# Patient Record
Sex: Male | Born: 1945 | Race: Black or African American | Hispanic: No | Marital: Married
Health system: Southern US, Community
[De-identification: ages and names within clinical notes are randomized; demographics above are authoritative.]

---

## 2006-07-04 ENCOUNTER — Other Ambulatory Visit: Payer: Self-pay

## 2006-07-04 ENCOUNTER — Inpatient Hospital Stay: Payer: Self-pay | Admitting: General Surgery

## 2007-10-22 ENCOUNTER — Ambulatory Visit: Payer: Self-pay | Admitting: Family Medicine

## 2009-01-18 ENCOUNTER — Inpatient Hospital Stay: Payer: Self-pay | Admitting: Internal Medicine

## 2009-03-01 ENCOUNTER — Ambulatory Visit: Payer: Self-pay | Admitting: Gastroenterology

## 2009-03-01 DIAGNOSIS — R933 Abnormal findings on diagnostic imaging of other parts of digestive tract: Secondary | ICD-10-CM

## 2009-03-01 LAB — CONVERTED CEMR LAB
Albumin: 3.6 g/dL (ref 3.5–5.2)
Alkaline Phosphatase: 95 units/L (ref 39–117)
BUN: 14 mg/dL (ref 6–23)
Basophils Relative: 0.8 % (ref 0.0–3.0)
Eosinophils Relative: 4 % (ref 0.0–5.0)
GFR calc non Af Amer: 54.39 mL/min (ref >60)
Glucose, Bld: 360 mg/dL — ABNORMAL HIGH (ref 70–99)
INR: 1 (ref 0.8–1.0)
MCV: 96.3 fL (ref 78.0–100.0)
Monocytes Absolute: 0.4 10*3/uL (ref 0.1–1.0)
Monocytes Relative: 4.7 % (ref 3.0–12.0)
Neutrophils Relative %: 68.1 % (ref 43.0–77.0)
Potassium: 4.4 meq/L (ref 3.5–5.1)
RBC: 4.49 M/uL (ref 4.22–5.81)
Total Bilirubin: 1 mg/dL (ref 0.3–1.2)
WBC: 8.9 10*3/uL (ref 4.5–10.5)

## 2009-03-17 ENCOUNTER — Encounter (INDEPENDENT_AMBULATORY_CARE_PROVIDER_SITE_OTHER): Payer: Self-pay | Admitting: *Deleted

## 2009-03-17 ENCOUNTER — Ambulatory Visit (HOSPITAL_COMMUNITY): Admission: RE | Admit: 2009-03-17 | Discharge: 2009-03-17 | Payer: Self-pay | Admitting: Gastroenterology

## 2009-03-17 ENCOUNTER — Encounter: Payer: Self-pay | Admitting: Gastroenterology

## 2009-09-05 ENCOUNTER — Telehealth: Payer: Self-pay | Admitting: Gastroenterology

## 2010-09-26 NOTE — Progress Notes (Signed)
Summary: med questions  Phone Note Call from Patient Call back at Home Phone [phone]   Caller: Debarah Crape, wife Call For: Dr. Christella Hartigan Reason for Call: Talk to Nurse Summary of Call: pt's ins changed and will no longer cover Creon... would like something their new ins will cover... would like to know if Pancreaze specifically would be an ok replacement Initial call taken by: Vallarie Mare,  September 05, 2009 3:27 PM  Follow-up for Phone Call        Called pt and Ins will cover pancrease Chales Abrahams CMA Duncan Dull)  September 05, 2009 3:38 PM   Additional Follow-up for Phone Call Additional follow up Details #1::        pancrease will work well.  let him know that I called it into his pharmacy Additional Follow-up by: Rachael Fee MD,  September 06, 2009 9:20 AM    Additional Follow-up for Phone Call Additional follow up Details #2::    pt aware Follow-up by: Chales Abrahams CMA Duncan Dull),  September 06, 2009 10:26 AM  New/Updated Medications: PANCREAZE 14782 UNIT CPEP (PANCRELIPASE (LIP-PROT-AMYL)) take 2 pills with every meal and one pill with every snack Prescriptions: PANCREAZE 95621 UNIT CPEP (PANCRELIPASE (LIP-PROT-AMYL)) take 2 pills with every meal and one pill with every snack  #270 x 11   Entered and Authorized by:   Rachael Fee MD   Signed by:   Chales Abrahams CMA (AAMA) on 09/06/2009   Method used:   Electronically to        [facility] [phone].* (retail)       [address]       [place], [place]  [address]       Ph: [phone]       Fax: [phone]   RxID:   [id number]

## 2010-11-16 ENCOUNTER — Inpatient Hospital Stay: Payer: Self-pay | Admitting: Internal Medicine

## 2010-12-03 LAB — GLUCOSE, CAPILLARY
Glucose-Capillary: 265 mg/dL — ABNORMAL HIGH (ref 70–99)
Glucose-Capillary: 325 mg/dL — ABNORMAL HIGH (ref 70–99)
Glucose-Capillary: 333 mg/dL — ABNORMAL HIGH (ref 70–99)

## 2011-10-19 ENCOUNTER — Inpatient Hospital Stay: Payer: Self-pay | Admitting: Internal Medicine

## 2011-10-19 LAB — URINALYSIS, COMPLETE
RBC,UR: 12669 /HPF (ref 0–5)
Squamous Epithelial: NONE SEEN
WBC UR: 1429 /HPF (ref 0–5)

## 2011-10-19 LAB — CBC WITH DIFFERENTIAL/PLATELET
Basophil %: 0.6 %
Eosinophil %: 1.7 %
HGB: 11.4 g/dL — ABNORMAL LOW (ref 13.0–18.0)
MCH: 32.3 pg (ref 26.0–34.0)
MCV: 95 fL (ref 80–100)
Monocyte #: 0.9 10*3/uL — ABNORMAL HIGH (ref 0.0–0.7)
Monocyte %: 7.8 %
Neutrophil #: 8.4 10*3/uL — ABNORMAL HIGH (ref 1.4–6.5)
Neutrophil %: 74.7 %
RBC: 3.54 10*6/uL — ABNORMAL LOW (ref 4.40–5.90)
WBC: 11.3 10*3/uL — ABNORMAL HIGH (ref 3.8–10.6)

## 2011-10-19 LAB — PROTIME-INR: INR: 1.1

## 2011-10-19 LAB — BASIC METABOLIC PANEL
Anion Gap: 10 (ref 7–16)
BUN: 40 mg/dL — ABNORMAL HIGH (ref 7–18)
Calcium, Total: 8.6 mg/dL (ref 8.5–10.1)
Chloride: 107 mmol/L (ref 98–107)
Co2: 26 mmol/L (ref 21–32)
Creatinine: 2.99 mg/dL — ABNORMAL HIGH (ref 0.60–1.30)
EGFR (Non-African Amer.): 23 — ABNORMAL LOW
Sodium: 143 mmol/L (ref 136–145)

## 2011-10-19 LAB — APTT: Activated PTT: 32.5 secs (ref 23.6–35.9)

## 2011-10-20 LAB — BASIC METABOLIC PANEL
Anion Gap: 14 (ref 7–16)
Chloride: 107 mmol/L (ref 98–107)
Co2: 22 mmol/L (ref 21–32)
Osmolality: 292 (ref 275–301)
Potassium: 3.6 mmol/L (ref 3.5–5.1)

## 2011-10-20 LAB — CBC WITH DIFFERENTIAL/PLATELET
Basophil #: 0 10*3/uL (ref 0.0–0.1)
Basophil %: 0.4 %
Lymphocyte #: 1.9 10*3/uL (ref 1.0–3.6)
Lymphocyte %: 19.2 %
Monocyte %: 9.1 %
Neutrophil %: 69.3 %
Platelet: 171 10*3/uL (ref 150–440)
RBC: 3.28 10*6/uL — ABNORMAL LOW (ref 4.40–5.90)
RDW: 14.7 % — ABNORMAL HIGH (ref 11.5–14.5)
WBC: 10.1 10*3/uL (ref 3.8–10.6)

## 2011-10-21 LAB — URINE CULTURE

## 2011-10-22 LAB — URINE CULTURE

## 2011-10-22 LAB — CREATININE, SERUM
Creatinine: 2.48 mg/dL — ABNORMAL HIGH (ref 0.60–1.30)
EGFR (African American): 34 — ABNORMAL LOW
EGFR (Non-African Amer.): 28 — ABNORMAL LOW

## 2011-10-22 LAB — HEMOGLOBIN: HGB: 10.2 g/dL — ABNORMAL LOW (ref 13.0–18.0)

## 2011-11-28 ENCOUNTER — Inpatient Hospital Stay: Payer: Self-pay | Admitting: Internal Medicine

## 2011-11-28 LAB — CBC
HCT: 37 % — ABNORMAL LOW (ref 40.0–52.0)
HGB: 12.2 g/dL — ABNORMAL LOW (ref 13.0–18.0)
MCHC: 32.9 g/dL (ref 32.0–36.0)
MCV: 97 fL (ref 80–100)
Platelet: 201 10*3/uL (ref 150–440)
RBC: 3.81 10*6/uL — ABNORMAL LOW (ref 4.40–5.90)
RDW: 15.7 % — ABNORMAL HIGH (ref 11.5–14.5)

## 2011-11-28 LAB — COMPREHENSIVE METABOLIC PANEL
Albumin: 3 g/dL — ABNORMAL LOW (ref 3.4–5.0)
Anion Gap: 7 (ref 7–16)
BUN: 35 mg/dL — ABNORMAL HIGH (ref 7–18)
Bilirubin,Total: 0.3 mg/dL (ref 0.2–1.0)
Calcium, Total: 8.8 mg/dL (ref 8.5–10.1)
Chloride: 108 mmol/L — ABNORMAL HIGH (ref 98–107)
Co2: 26 mmol/L (ref 21–32)
Creatinine: 3.4 mg/dL — ABNORMAL HIGH (ref 0.60–1.30)
EGFR (African American): 24 — ABNORMAL LOW
Glucose: 153 mg/dL — ABNORMAL HIGH (ref 65–99)
Osmolality: 292 (ref 275–301)
SGOT(AST): 122 U/L — ABNORMAL HIGH (ref 15–37)
Sodium: 141 mmol/L (ref 136–145)
Total Protein: 8.1 g/dL (ref 6.4–8.2)

## 2011-11-28 LAB — URINALYSIS, COMPLETE
Bacteria: NONE SEEN
Bilirubin,UR: NEGATIVE
Glucose,UR: 50 mg/dL (ref 0–75)
Ketone: NEGATIVE
Nitrite: NEGATIVE
Ph: 5 (ref 4.5–8.0)
RBC,UR: 11 /HPF (ref 0–5)
Squamous Epithelial: 1
WBC UR: 33 /HPF (ref 0–5)

## 2011-11-28 LAB — PROTIME-INR: Prothrombin Time: 14.3 secs (ref 11.5–14.7)

## 2011-11-29 LAB — BASIC METABOLIC PANEL
Anion Gap: 9 (ref 7–16)
BUN: 35 mg/dL — ABNORMAL HIGH (ref 7–18)
Calcium, Total: 8 mg/dL — ABNORMAL LOW (ref 8.5–10.1)
Chloride: 111 mmol/L — ABNORMAL HIGH (ref 98–107)
Co2: 22 mmol/L (ref 21–32)
Creatinine: 3.16 mg/dL — ABNORMAL HIGH (ref 0.60–1.30)
EGFR (African American): 26 — ABNORMAL LOW
Potassium: 4.2 mmol/L (ref 3.5–5.1)
Sodium: 142 mmol/L (ref 136–145)

## 2011-11-30 LAB — BASIC METABOLIC PANEL
Anion Gap: 9 (ref 7–16)
BUN: 34 mg/dL — ABNORMAL HIGH (ref 7–18)
Chloride: 114 mmol/L — ABNORMAL HIGH (ref 98–107)
Co2: 20 mmol/L — ABNORMAL LOW (ref 21–32)
Creatinine: 2.89 mg/dL — ABNORMAL HIGH (ref 0.60–1.30)
EGFR (African American): 28 — ABNORMAL LOW
EGFR (Non-African Amer.): 23 — ABNORMAL LOW
Glucose: 65 mg/dL (ref 65–99)
Osmolality: 291 (ref 275–301)
Potassium: 4.1 mmol/L (ref 3.5–5.1)
Sodium: 143 mmol/L (ref 136–145)

## 2011-11-30 LAB — HEMOGLOBIN A1C: Hemoglobin A1C: 7.8 % — ABNORMAL HIGH (ref 4.2–6.3)

## 2011-11-30 LAB — URINE CULTURE

## 2011-12-01 DIAGNOSIS — I517 Cardiomegaly: Secondary | ICD-10-CM

## 2011-12-01 LAB — URINE CULTURE

## 2012-04-20 ENCOUNTER — Observation Stay: Payer: Self-pay | Admitting: Internal Medicine

## 2012-04-20 LAB — URINALYSIS, COMPLETE
Bilirubin,UR: NEGATIVE
Glucose,UR: NEGATIVE mg/dL (ref 0–75)
Ketone: NEGATIVE
Protein: 100
Specific Gravity: 1.013 (ref 1.003–1.030)
WBC UR: 63 /HPF (ref 0–5)

## 2012-04-20 LAB — CBC
HCT: 37.3 % — ABNORMAL LOW (ref 40.0–52.0)
HGB: 12.7 g/dL — ABNORMAL LOW (ref 13.0–18.0)
MCV: 95 fL (ref 80–100)
RDW: 14.4 % (ref 11.5–14.5)
WBC: 9 10*3/uL (ref 3.8–10.6)

## 2012-04-20 LAB — CK TOTAL AND CKMB (NOT AT ARMC)
CK, Total: 48 U/L (ref 35–232)
CK-MB: <0.5 ng/mL — ABNORMAL LOW (ref 0.5–3.6)

## 2012-04-20 LAB — COMPREHENSIVE METABOLIC PANEL
Alkaline Phosphatase: 67 U/L (ref 50–136)
Bilirubin,Total: 0.6 mg/dL (ref 0.2–1.0)
Calcium, Total: 8.9 mg/dL (ref 8.5–10.1)
Chloride: 113 mmol/L — ABNORMAL HIGH (ref 98–107)
Co2: 23 mmol/L (ref 21–32)
Creatinine: 2.37 mg/dL — ABNORMAL HIGH (ref 0.60–1.30)
EGFR (African American): 32 — ABNORMAL LOW
EGFR (Non-African Amer.): 28 — ABNORMAL LOW
Osmolality: 292 (ref 275–301)
SGPT (ALT): 25 U/L (ref 12–78)

## 2012-04-20 LAB — TROPONIN I: Troponin-I: <0.02 ng/mL

## 2012-04-21 LAB — URINE CULTURE

## 2012-04-26 LAB — CULTURE, BLOOD (SINGLE)

## 2012-05-27 DEATH — deceased

## 2014-01-27 IMAGING — CT CT STONE STUDY
1 of 2 series · 15 of 32 positions shown, 19 images · non-contrast
Comparison: none

REASON FOR EXAM: hematuria and new renal insufficiency
COMMENTS:

PROCEDURE:     CT  - CT ABDOMEN /PELVIS WO (STONE)  - October 19, 2011 [DATE]
RESULT:     CT of the abdomen and pelvis dated 10/19/2011.
TECHNIQUE: Helical noncontrasted 3 mm sections were obtained from the lung
bases through the pubic symphysis. Comparison made to prior study dated
11/17/2010.

[Series 4: stone · axial · 0.69mm/px · z∈[-448,-7]mm · 15 of 161 slices shown, 19 images]
[im 7/161  soft-tissue]
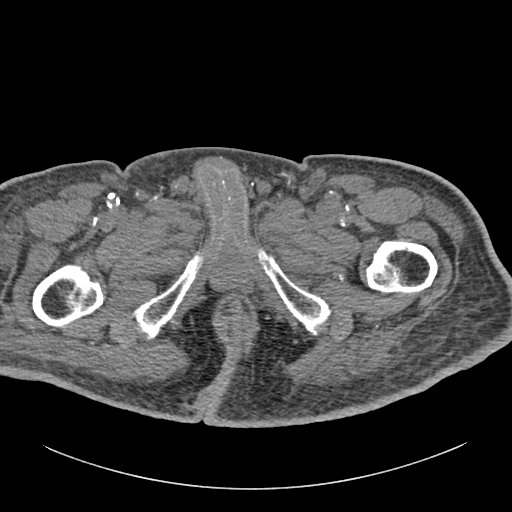
[im 7/161  bone]
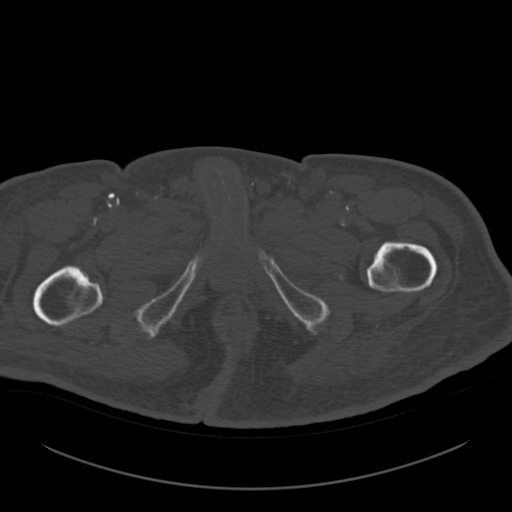
[im 21/161  soft-tissue]
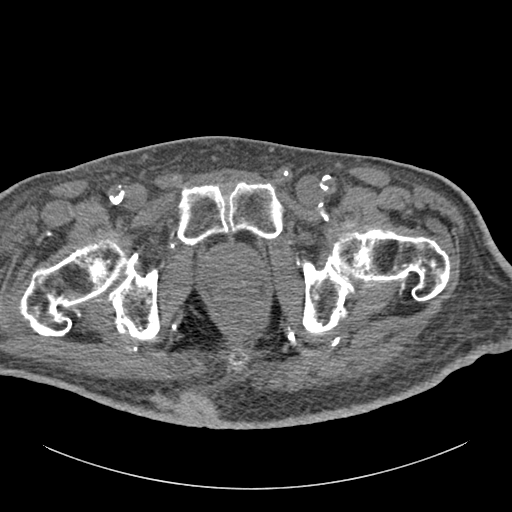
[im 34/161  soft-tissue]
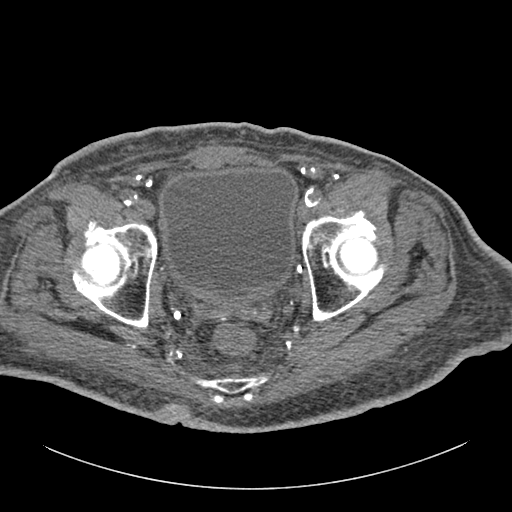
[im 47/161  soft-tissue]
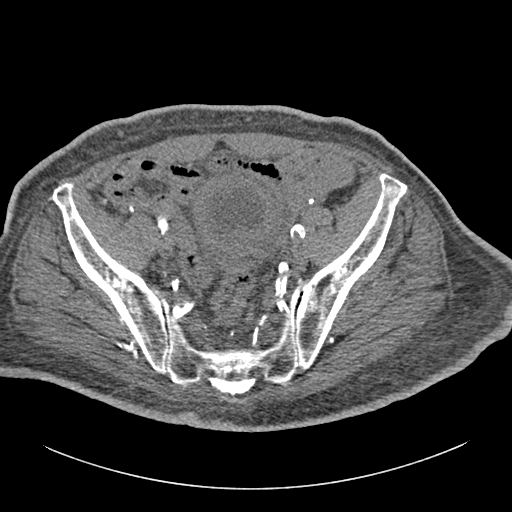
[im 54/161  soft-tissue]
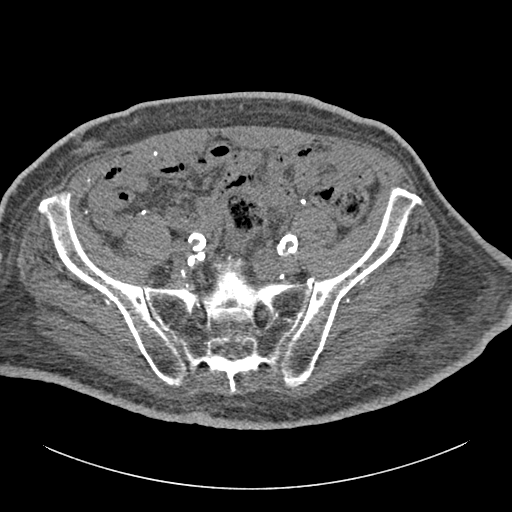
[im 67/161  soft-tissue]
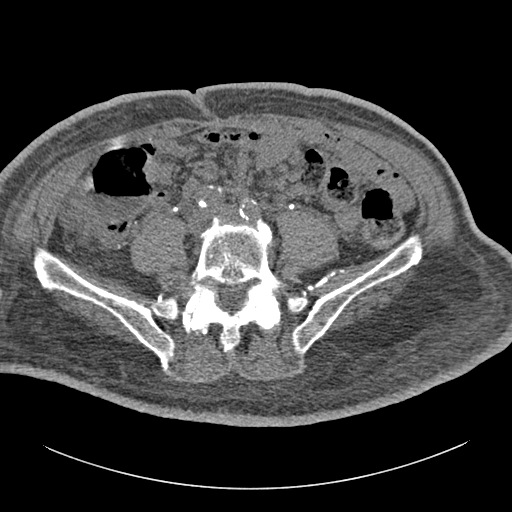
[im 81/161  soft-tissue]
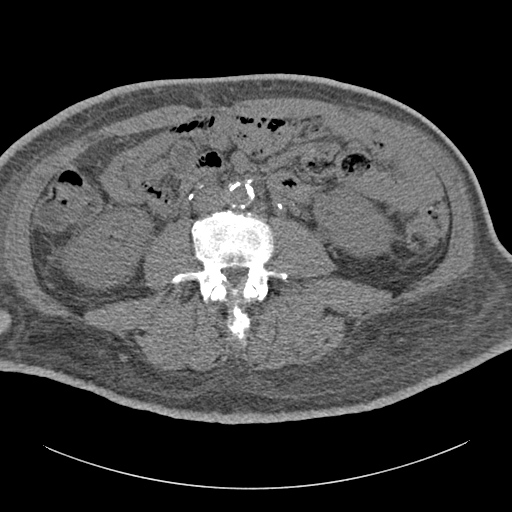
[im 94/161  soft-tissue]
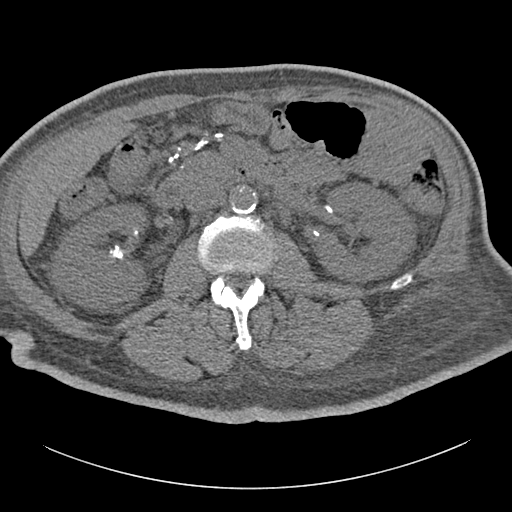
[im 107/161  soft-tissue]
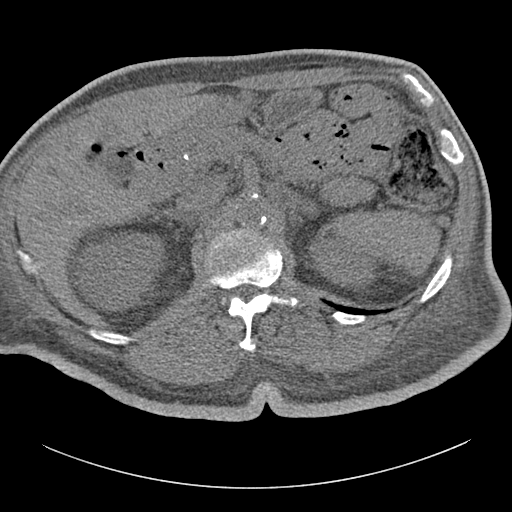
[im 107/161  bone]
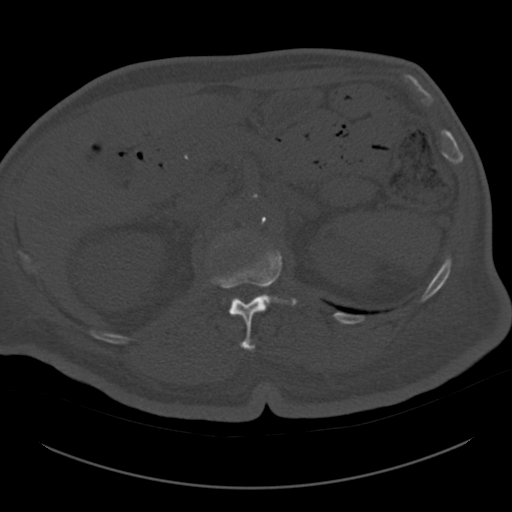
[im 114/161  soft-tissue]
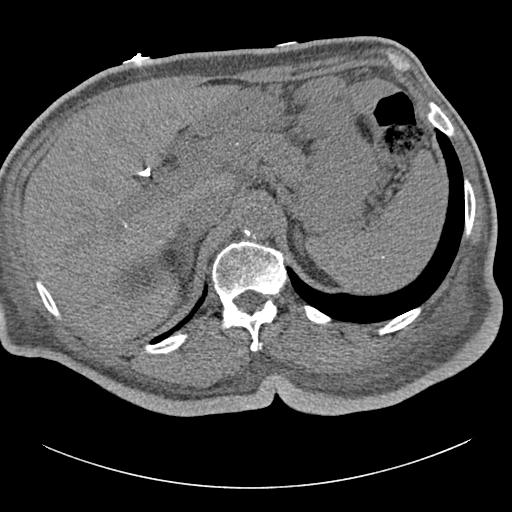
[im 127/161  soft-tissue]
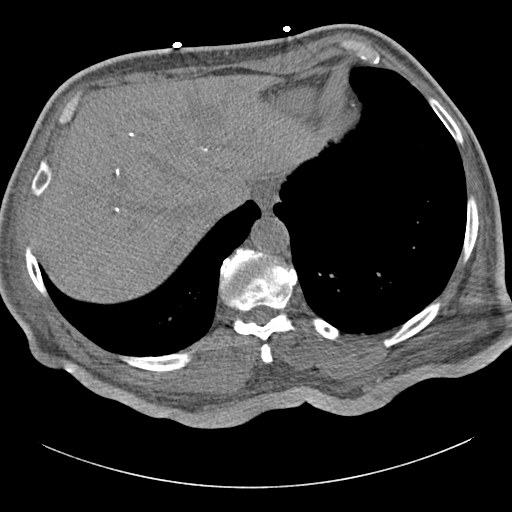
[im 134/161  lung]
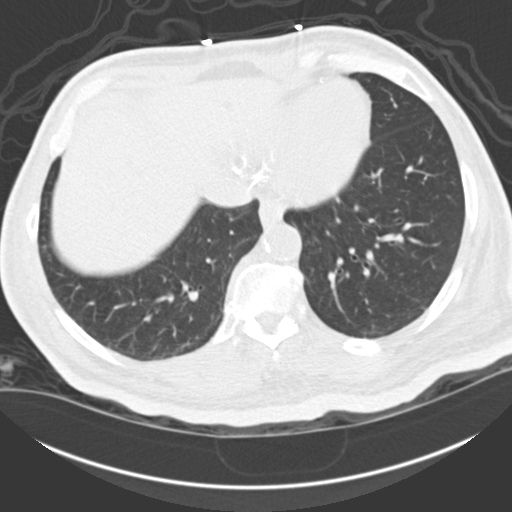
[im 141/161  soft-tissue]
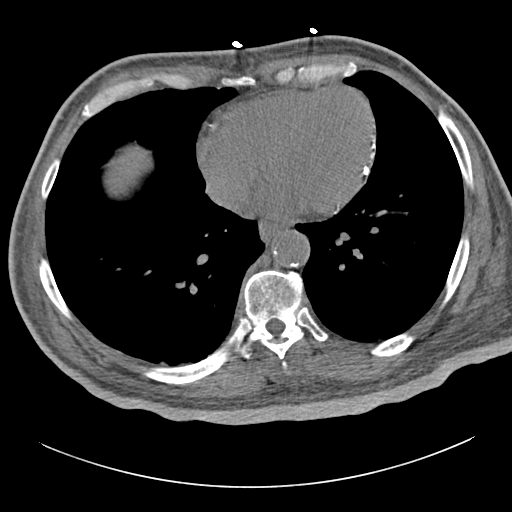
[im 141/161  lung]
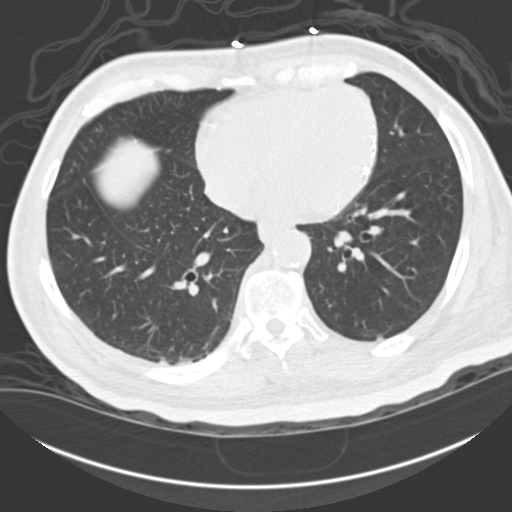
[im 147/161  lung]
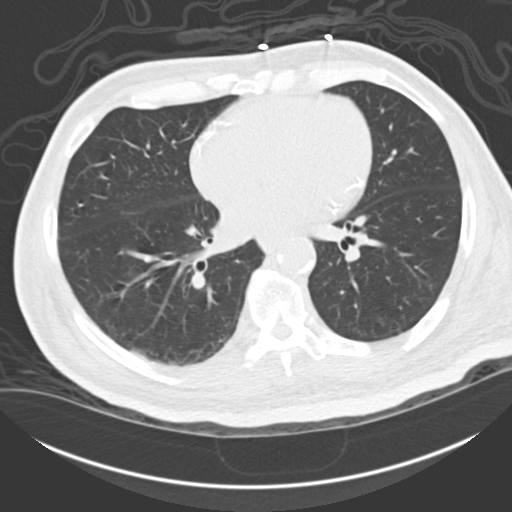
[im 154/161  soft-tissue]
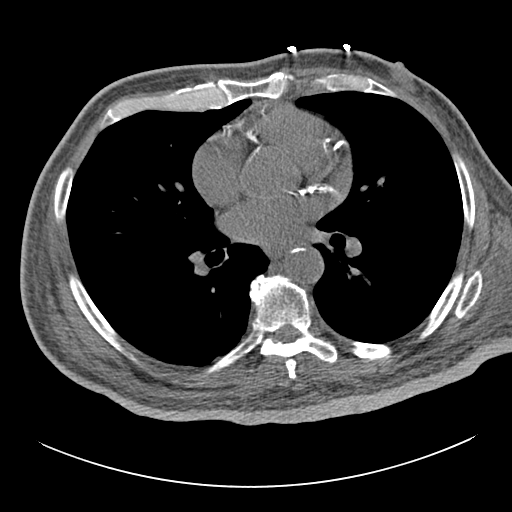
[im 154/161  lung]
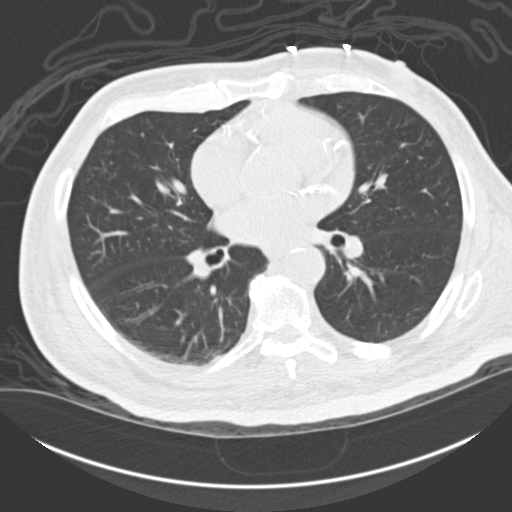

[15 of 32 positions shown; findings below may reference images not displayed]

FINDINGS: The lung bases are unremarkable.

Evaluation of the liver demonstrates a stable low attenuating mass in the
left lobe liver. Study was correlated with prior studies from 11/17/2010 and
01/20/2009. Multiple calcified densities projected within the liver
parenchyma. Bases status post cholecystectomy.

Evaluation of the kidneys demonstrates nonobstructing bilateral medullary
calculi as well as vascular calcifications. There is no evidence of
hydronephrosis, hydroureter nor ureterolithiasis. Punctate calcifications
project within the spleen. The adrenals are unremarkable. The pancreas
demonstrates stable prominence of the pancreatic head. No CT evidence of
bowel obstruction or secondary signs reflecting enteritis, colitis,
diverticulitis nor appendicitis. There is no evidence of abdominal aortic
aneurysm.
IMPRESSION: Nonobstructing bilateral medullary calculi without evidence
of obstructive uropathy.
2. Findings like regions sequela prior granulomatous disease in the spleen
and liver.
3. Stable mass within the liver likely thinning a hemangioma.
4. Again noted is mild to moderate diffuse urinary bladder wall thickening.
This finding is stable when compared to previous study again there is
sequela of cystitis. Further evaluation with direct visualization
recommended clinically warranted.

## 2014-01-29 IMAGING — CT CT STONE STUDY
1 of 2 series · 15 of 32 positions shown, 19 images · non-contrast
Comparison: none

REASON FOR EXAM: acute renal  failure
COMMENTS:

PROCEDURE:     CT  - CT ABDOMEN /PELVIS WO (STONE)  - October 21, 2011  [DATE]
RESULT:     Comparison: 10/19/2011
TECHNIQUE: Multiple axial images from the lung bases to the symphysis pubis
were obtained without oral and without intravenous contrast.

[Series 2: stone · axial · 0.77mm/px · z∈[-676,-278]mm · 15 of 151 slices shown, 19 images]
[im 12/151  soft-tissue]
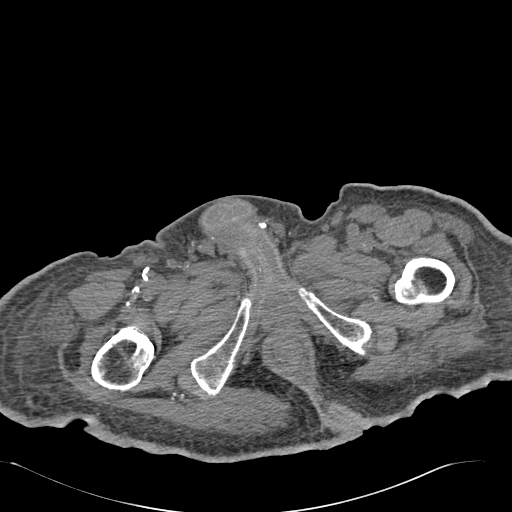
[im 12/151  bone]
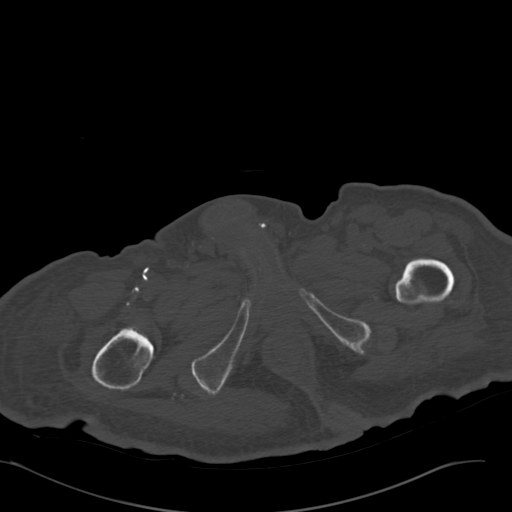
[im 23/151  soft-tissue]
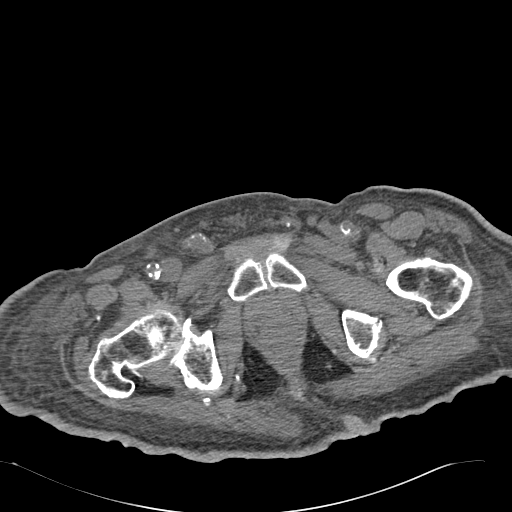
[im 34/151  soft-tissue]
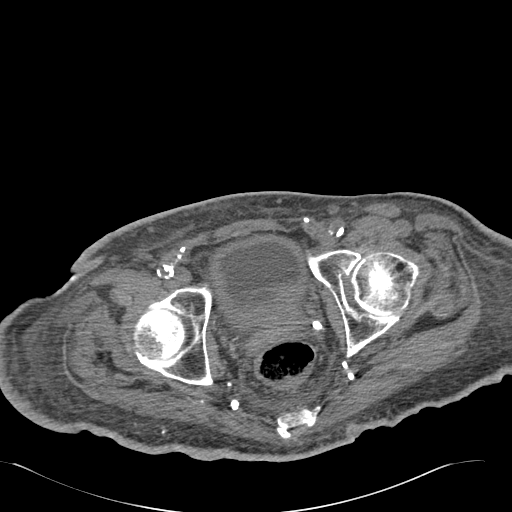
[im 45/151  soft-tissue]
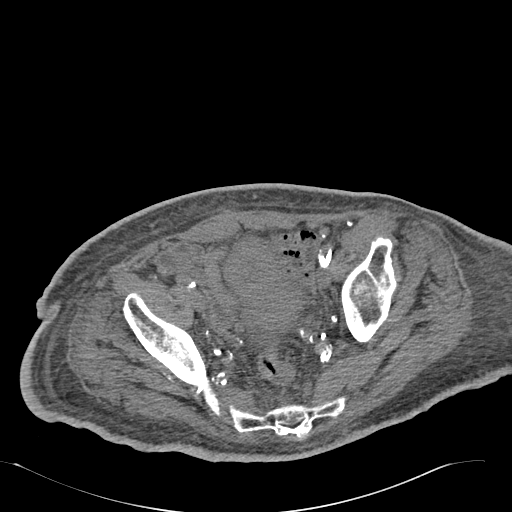
[im 56/151  soft-tissue]
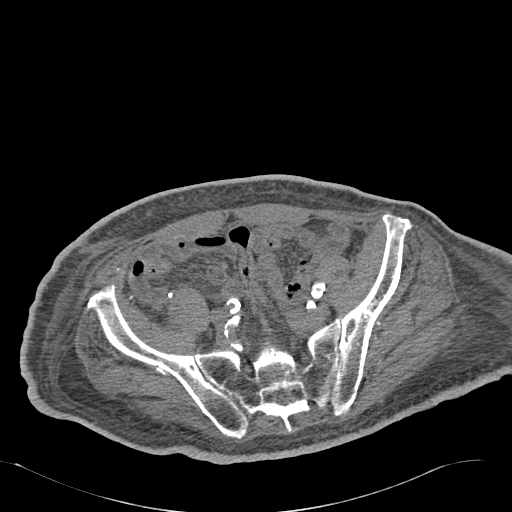
[im 67/151  soft-tissue]
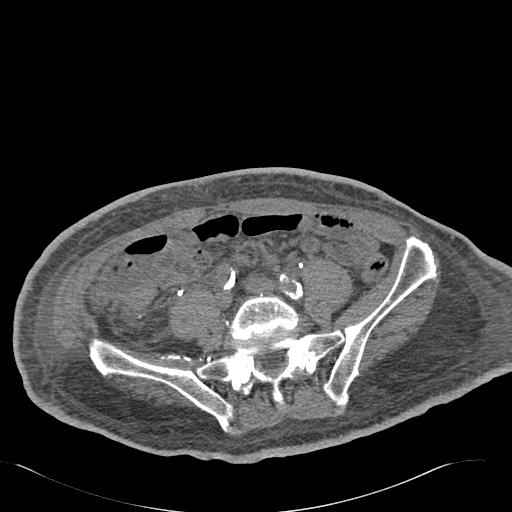
[im 78/151  soft-tissue]
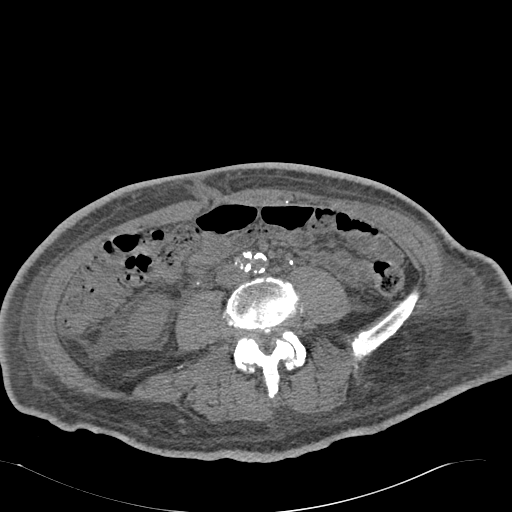
[im 89/151  soft-tissue]
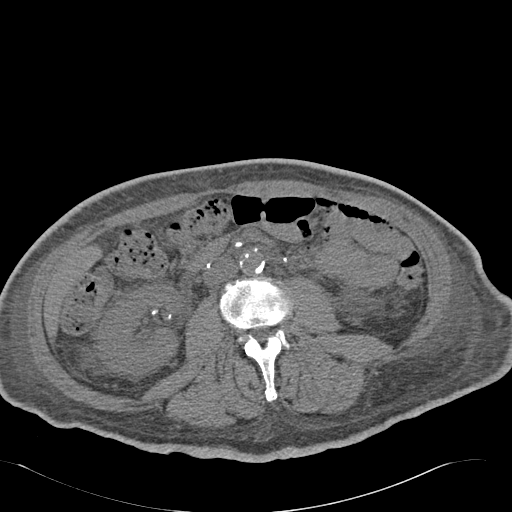
[im 101/151  soft-tissue]
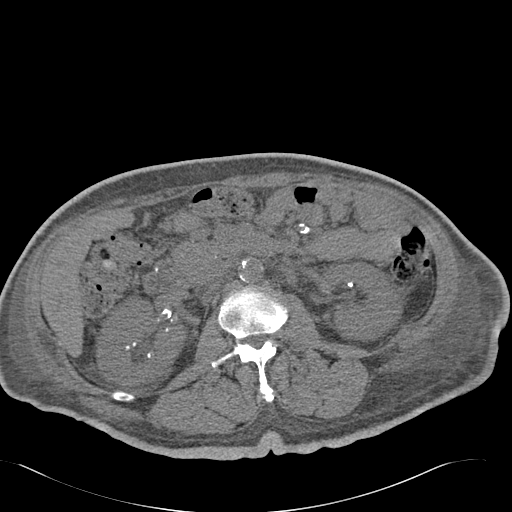
[im 101/151  bone]
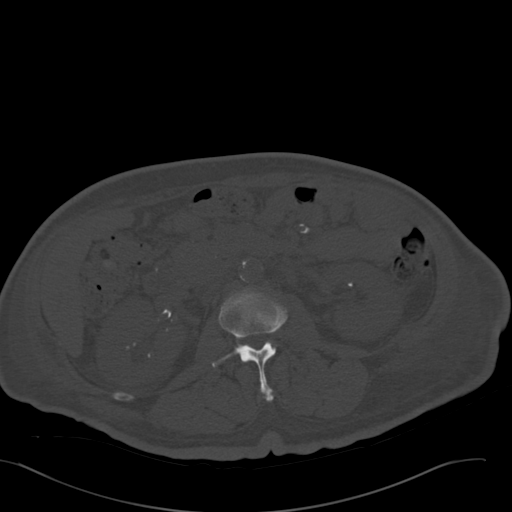
[im 112/151  soft-tissue]
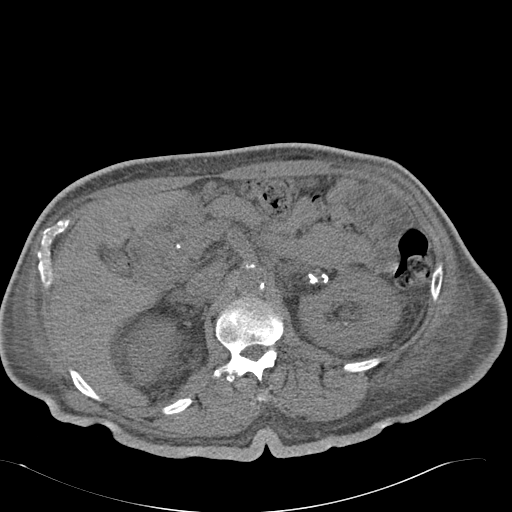
[im 123/151  soft-tissue]
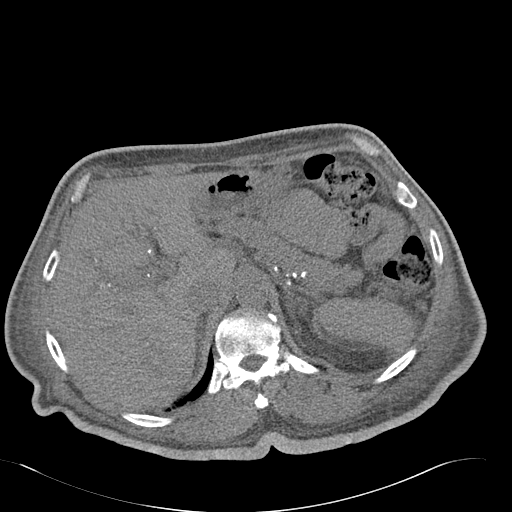
[im 128/151  lung]
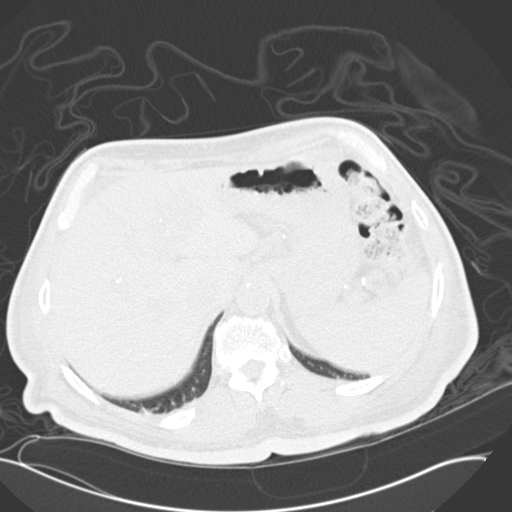
[im 134/151  soft-tissue]
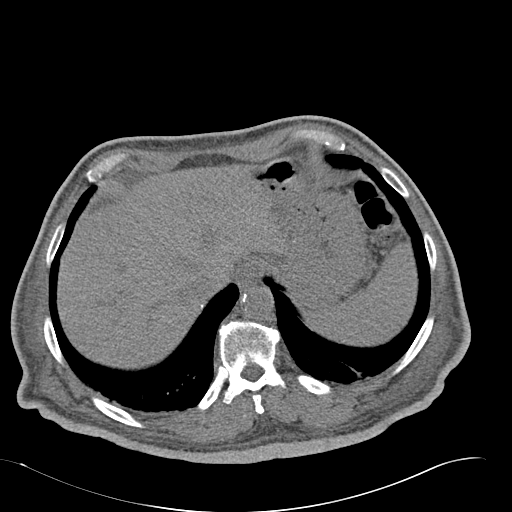
[im 134/151  lung]
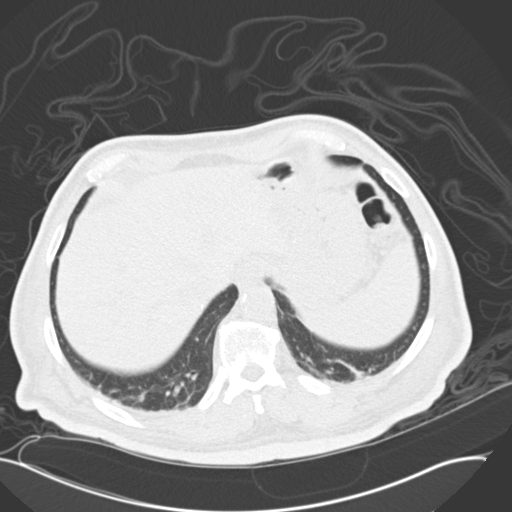
[im 139/151  lung]
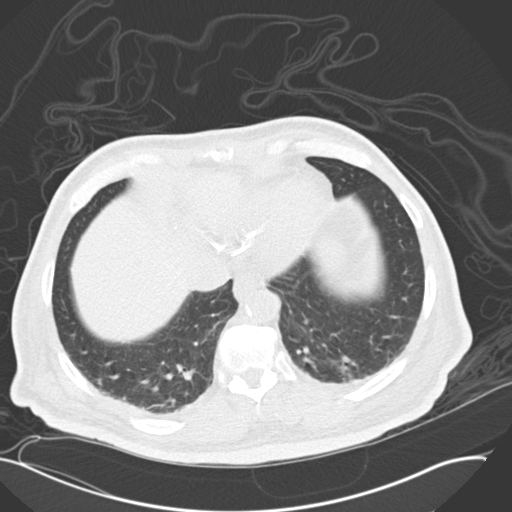
[im 145/151  soft-tissue]
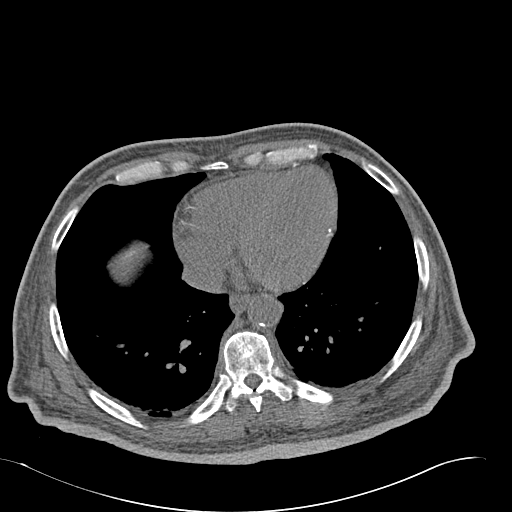
[im 145/151  lung]
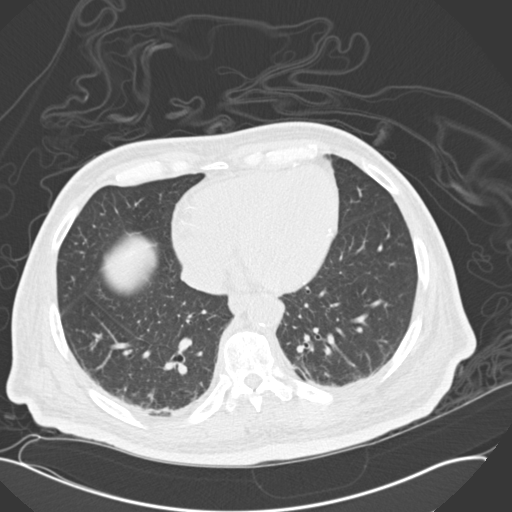

[15 of 32 positions shown; findings below may reference images not displayed]

FINDINGS: Mild basilar opacities likely represent atelectasis. There are
calcifications in the coronary arteries.

Lack of intravenous contrast limits evaluation of the solid abdominal
organs.  There are multiple calcifications in the central aspect of the
liver which are likely vascular. There is a round low-attenuation lesion in
the left hepatic lobe which is indeterminate on this noncontrast CT, but
likely correlate with the hemangioma seen on the prior CT of 01/20/2009, and
it is similar in size. Surgical clips are seen from prior cholecystectomy.
Punctate calcifications in the spleen are likely sequela of old prior
infection. The adrenals and pancreas are unremarkable.

There are multiple calcifications in the kidneys bilaterally. Several of
these are likely 3-5 mm calculi. Many of these calcifications are likely
vascular. There is no hydronephrosis or ureterectasis. There is a
circumferential bladder wall thickening, which is nonspecific.

There is diffuse anasarca. Vascular calcifications are seen in the abdominal
aorta an branch vessels. The small and large bowel are normal in caliber.
The appendix is not definitely visualized. However, there are no
inflammatory changes in the right lower quadrant.

No aggressive lytic or sclerotic osseous lesions are identified.
IMPRESSION: 1. Bilateral nephrolithiasis, without hydronephrosis. Many of the calcific
densities in the bilateral renal hila are likely vascular.
2. Diffuse anasarca.
3. Circumferential bladder wall thickening is nonspecific. This may in part
be related to underdistention. Cystitis or malignancy can have a similar
appearance. Correlate with urinalysis. Further evaluation could be provide
with direct visualization, as indicated.

## 2014-12-14 NOTE — Discharge Summary (Signed)
PATIENT NAME:  Walter HumphreySMITH, [name] MR#:  [id number] DATE OF BIRTH:  08-21-1946  DATE OF ADMISSION:  04/20/2012 DATE OF DISCHARGE:  04/22/2012  DIAGNOSES:  1. Failure to thrive. 2. Bilateral heel ulcers, pressure sores. 3. Chronic kidney disease, stage III.  4. Hypertension,. 5. Cerebrovascular accident. 6. Anemia. 7. Diabetes. 8. Frequent urinary tract infections.  9. Urinary incontinence.   DISPOSITION: Patient is being discharged home with home health physical therapy, R.N. aide Hoyer/hospital bed have been arranged for the patient.    DISCHARGE MEDICATIONS:  1. Amlodipine 10 mg daily.  2. Celexa 20 mg daily.  3. Ambien 12.5 mg at bedtime. 4. Pravachol 80 mg daily.  5. Cilostazol 100 mg b.i.d.  6. Vitamin D3 1000 international units once a day. 7. Proscar 5 mg daily.  8. Flomax 0.4 mg daily.  9. Lantus 20 units subcutaneously daily.  10. Toprol-XL 50 mg once a day.   DIET: Low sodium, 1800 calorie ADA diet.   FOLLOW UP: Follow up with primary care physician, Dr. Thana AtesMorrisey, in 1 to 2 weeks after discharge.   LABORATORY, DIAGNOSTIC, AND RADIOLOGICAL DATA: Urine culture showed coagulase-negative staph which is not a urinary pathogen. Blood culture showed no growth in 36 hours. CT of the head showed no acute abnormality. CBC was normal and hemoglobin 12.7, glucose 105, BUN 43, creatinine 2.37, chloride 113. Cardiac enzymes negative. Rest of complete metabolic panel normal.   CONSULTATION: Wound care consultation with Dr. Thelma Bargeaks.   HOSPITAL COURSE: Patient is a 69 year old male with past medical history of diabetes, CVA, bedbound who was brought in by his family for weakness and lower extremity ulcers. Patient's family needed some assistance at home. Home health physical therapy, R.N., aide were arranged for the patient. Patient's wife was adamant that she did not want placement. [facility]. He was evaluated by Dr. Thelma Bargeaks for his bilateral  heel ulcers/pressure sores who felt that he had deep tissue injury and would not benefit from any intervention. He recommended that pressure be avoided on those area. Patient has chronic kidney disease and his creatinine was stable. He was presumed to have a urinary tract infection based on his urinalysis, however, he was afebrile with normal white count. His urine culture grew 50,000 CFU coagulase negative staph which is not a urinary pathogen so therefore he was not on any antibiotics. His last hemoglobin A1c was 7.8. His diabetes remained well controlled on his current regimen. He is a DO NOT RESUSCITATE. He is being discharged home in a stable condition.   TIME SPENT: 45 minutes.   ____________________________ Darrick MeigsSangeeta Cami Delawder, MD sp:cms D: 04/22/2012 15:19:52 ET T: 04/23/2012 13:12:51 ET JOB#: [id number]  cc: Darrick MeigsSangeeta Alysse Rathe, MD, <Dictator> Dennison MascotLemont Morrisey, MD  Darrick MeigsSANGEETA Shalik Sanfilippo MD ELECTRONICALLY SIGNED 04/23/2012 15:47

## 2014-12-14 NOTE — H&P (Signed)
PATIENT NAME:  Walter [name], Cotey M MR#:  [id number] DATE OF BIRTH:  [birth year]  DATE OF ADMISSION:  04/20/2012  PRIMARY CARE PHYSICIAN: Walter MascotLemont Morrisey, MD  CHIEF COMPLAINT: Soreness in the left leg, inability to care for self, and urinary tract infection.   HISTORY OF PRESENT ILLNESS: Walter [name] is a 69 year old African American gentleman who is well known to our service from previous many admissions who has history of cerebrovascular accident who is bedbound, gradually weakening over the past several months, who comes to the emergency room with increasing weakness and pain on her left heel with development of a pressure sore and wife is having increased difficulty managing and caring for him at home.  The patient is found to have a urinary tract infection also in the Emergency Room. He is  being admitted for further evaluation and management.   PAST MEDICAL HISTORY:  1. Acute cerebrovascular accident with tiny punctate infarcts on the right side and two on the left cerebral hemisphere.  2. History of right old cerebrovascular accident in the past with right-sided weakness more so with right upper extremity contracture. 3. Hypertension.  4. Type 2 diabetes, on insulin.  5. Urinary incontinence.  6. Frequent urinary tract infections.  7. Chronic kidney disease, stage III.  MEDICATIONS:  1. Pravachol 80 mg daily at bedtime.  2. Cilostazol 100 mg twice a day. 3. Vitamin D 3000 international units daily.  4. Proscar 5 mg p.o. daily.  5. Tamsulosin 0.4 mg at bedtime.  6. Lantus 20 units daily.  7. Ambien CR 12.5 mg p.o. daily.  8. Metoprolol ER p.o. daily.  9. Amlodipine 10 mg daily.   CODE STATUS: NO CODE. The patient carries an out-of-facility NO CODE, DO NOT RESUSCITATE form.   SOCIAL HISTORY: The patient lives at home with his wife who cares for him. The patient is currently bedbound and is not able to ambulate. He is a nonsmoker, nonalcoholic.   PAST SURGICAL HISTORY:  1. Cholecystectomy.   2. Appendectomy.  3. Right eye laser surgery.   ALLERGIES: No known drug allergies.   FAMILY HISTORY: Son is on dialysis.   REVIEW OF SYSTEMS: CONSTITUTIONAL: No fever. Positive for fatigue and weakness. EYES: No blurred or double vision or cataracts. ENT: No tinnitus, ear pain, or hearing loss. RESPIRATORY: No cough, wheeze, hemoptysis, or chronic obstructive pulmonary disease. CARDIOVASCULAR: No chest pain, orthopnea, edema, or palpitations. GASTROINTESTINAL: No nausea, vomiting, diarrhea, abdominal pain, or gastroesophageal reflux disease. GENITOURINARY: No dysuria or hematuria. ENDOCRINE: No polyuria, nocturia, or thyroid problems. HEMATOLOGY: Chronic anemia. Easy bruising. SKIN: No acne or rash. MUSCULOSKELETAL: Positive for arthritis, bedbound. NEURO: History of stroke with right upper extremity weakness. PSYCH: No anxiety or depression. All other systems are reviewed and negative.   PHYSICAL EXAMINATION:   GENERAL: The patient is awake. He is alert and oriented x3.  VITALS: Temperature 97.7, pulse 64, blood pressure 140/85, and saturation 95% on room air.   HEENT: Atraumatic, normocephalic. Pupils are equal, round, and reactive to light and accommodation. Extraocular movements intact. Oral mucosa is dry.   NECK: Supple. No JVD. No carotid bruit.   LUNGS: Clear to auscultation bilaterally. No rales, rhonchi, respiratory distress, or labored breathing.   HEART: Both the heart sounds are normal. Rate and rhythm is regular. PMI is not lateralized. Chest is nontender.   EXTREMITIES: Feeble pedal pulses and good femoral pulses.   SKIN: The patient has a pressure sore over the left heel, about 3 x 3 cm, and the skin  is intact. No foul smelling discharge. On the right heel, a 0.5 cm pressure sore is present. On the buttocks, the patient does have a superficial,  tiny pressure ulcer as well.   NEUROLOGIC: The patient is bedbound and has chronic right hemiparesis.   PSYCH: The patient is  awake, alert, and oriented x3.   LABORATORY DATA: Urinalysis is positive for urinary tract infection.  Glucose 105, BUN 43, creatinine 2.37, sodium 141, potassium 4, chloride 113, bicarbonate 23, and albumin 2.9. White count 9, hemoglobin and hematocrit 12.7 and 37.3, and platelet count 178. Troponin 0.02.   ASSESSMENT: 69 year old Walter [name] with chronic multiple medical problems who comes in with:  1. Failure to thrive: The patient's family has been having difficulty managing him at home. He is increasingly complaining of pain in the foot due to pressure ulcers on the left heel and he is bedbound. We will admit the patient to the medical floor for overnight observation. 2 gram, 1800 ADA calorie diet. We will start the patient on IV Rocephin for urinary tract infection. Have care management assistance with discharge planning.  2. Bilateral heel ulcers/pressure sores: The patient is bedbound. We will have wound clinic consultation for pressure sore management.  3. Chronic renal failure, baseline creatinine around 2.84: The patient's creatinine currently is 2.37. He appears clinically euvolemic and received some IV fluids in the ER.  4. Urinary tract infection: We will send blood cultures, urine cultures, and start the patient on IV Rocephin. We will change antibiotics according to sensitivities.  5. History of cerebrovascular accident with right upper extremity contracture. The patient is bedbound.  6. We will continue the rest of home medications. The patient's wife is interested with home health at home. We will have care management to see the patient in the morning.   CODE STATUS: The patient is a NO CODE, DO NOT RESUSCITATE. He carries out of facility DNR form.   The above was discussed with the patient and the patient's family members who were agreeable to it.   TIME SPENT: 50 minutes. ____________________________ Walter Hail Allena Katz, MD sap:slb D: 04/20/2012 19:31:16 ET T: 04/21/2012 07:09:19  ET JOB#: [id number]  cc: Walter [name] A. Allena Katz, MD, <Dictator> Walter Mascot, MD Willow Ora MD ELECTRONICALLY SIGNED 04/29/2012 15:44

## 2014-12-14 NOTE — Consult Note (Signed)
PATIENT NAME:  Walter [name], [name]:  [id number] DATE OF BIRTH:  July 27, 1946  DATE OF CONSULTATION:  04/21/2012  REFERRING PHYSICIAN:  Enedina FinnerSona Patel, MD  CONSULTING PHYSICIAN:  Sheppard Plumberimothy E. Prestin Munch, MD  REASON FOR CONSULTATION: Bilateral heel ulcers.   I have personally seen and examined Mr. Walter [name]. I have independently reviewed his medical records.   HISTORY OF PRESENT ILLNESS: Mr. Walter [name] is a 69 year old African American gentleman who has had multiple admissions to the hospital for failure to thrive. He recently was found to have a pressure sore on his left heel and was brought to the Emergency Room by his wife who was unable to care for him at home. In addition, he has also had a urinary tract infection and was admitted to the hospital for management of his urinary tract infection as well as for his bilateral heel wounds. He has a history of a previous stroke and is essentially bedbound. He has had a gradual course of weakening over the last several months and has required increasing escalation of care. The patient states that he has some discomfort in his left heel and states that he otherwise feels well. He has a good appetite. He is hungry. He denies any shortness of breath or chest pain.   PAST MEDICAL HISTORY:  1. Stroke. 2. Hypertension. 3. Diabetes. 4. Urinary incontinence. 5. Multiple kidney infections.    SOCIAL HISTORY: The patient lives at home with his wife who is his caregiver. He does not smoke or drink. He is bedbound and is unable to ambulate.   PAST SURGICAL HISTORY:  1. Cholecystectomy. 2. Appendectomy. 3. Right eye laser surgery.   The patient was unable to tell me that history but I did get that from his chart.   FAMILY HISTORY: His son is on dialysis. The patient was unable to give me that history but I did obtain that from the chart.   REVIEW OF SYSTEMS: As per history of present illness.   PHYSICAL EXAMINATION: Examination revealed an elderly gentleman in no acute  distress. He was able to speak in complete sentences. At times he was somewhat confused and lethargic although he did not appear to be in any distress. Examination of his lower extremities revealed chronic contractures. He had no pulses in his feet. He had no evidence of pedal edema. He had bilateral deep tissue injuries to both heels. The left measured approximately 3 cm and the right measured approximately 2 cm. There were no open areas. There was no drainage. There were no palpable posterior tibial or dorsalis pedis pulses. There were no other wounds present on the lower extremities.   ASSESSMENT AND PLAN: At the present time I believe that he has unstageable bilateral heel ulcers. I think that it would be best to manage these with off loading. I see no need for any further therapy at this time. I would not place any products on the wounds themselves such as DuoDERM or Tegaderm. I would leave them open to air. He should wear heel protectors at all times.   Thank you very much for allowing me to participate in his care.   ____________________________ Sheppard Plumberimothy E. Thelma Bargeaks, MD teo:drc D: 04/21/2012 11:16:45 ET T: 04/21/2012 12:22:31 ET JOB#: [id number]  cc: Marcial Pacasimothy E. Thelma Bargeaks, MD, <Dictator> Jasmine DecemberIMOTHY E Misaki Sozio MD ELECTRONICALLY SIGNED 04/22/2012 8:57

## 2014-12-14 NOTE — Consult Note (Signed)
Brief Consult Note: Diagnosis: bilateral heel deep tissue injury.   Patient was seen by consultant.   Consult note dictated.   Orders entered.   Comments: Bilateral heel "wounds" staged as deep tissue injury.  Electronic Signatures: Jasmine Decemberaks, [place] Nordquist E (MD)  (Signed 26-Aug-13 11:09)  Authored: Brief Consult Note   Last Updated: 26-Aug-13 11:09 by Jasmine Decemberaks, Halle Davlin E (MD)

## 2014-12-19 NOTE — H&P (Signed)
PATIENT NAME:  Walter [name], Walter [name] MR#:  [id number] DATE OF BIRTH:  05/10/1946  DATE OF ADMISSION:  10/19/2011  PRIMARY CARE PHYSICIAN: Dennison Mascot, MD   CHIEF COMPLAINT: Passing bloody urine.   HISTORY OF PRESENT ILLNESS: The patient is a 69 year old man with multiple medical issues. He states that he has been passing bloody urine for the past 2 to 3 days, and prior to that it had been ongoing for about a week or so; but since Wednesday the blood in the urine changed from pink to red to dark red gross hematuria with clots. The patient does not complain of any difficulty urinating, no abdominal pain. In the Emergency Room, he was found to be acute on chronic renal failure. A CT scan of the abdomen and pelvis showed nonobstructing bilateral medullary calculi as well as vascular calcifications. No hydronephrosis, hydroureter, or ureterolithiasis. Hospitalist Services were contacted for further evaluation. There was some thickening of the bladder that could be a cystitis.   PAST MEDICAL HISTORY:  1. Insulin-dependent diabetes. 2. Hypertension. 3. CVA with right-sided weakness. 4. Hyperlipidemia. 5. Chronic kidney disease. 6. Benign prostatic hypertrophy.   PAST SURGICAL HISTORY:  1. Cholecystectomy. 2. Appendectomy. 3. Right eye laser surgery.   ALLERGIES: No known drug allergies.   MEDICATIONS:  1. Percocet 5/325, 1 to 2 tablets every 6 hours as needed. 2. Ambien CR 12.5 mg at bedtime.  3. Aspirin 81 mg daily.  4. Pletal 100 mg b.i.d.   5. Insulin sliding scale.  6. Lantus 20 units daily.  7. Lisinopril 20 mg daily.  8. Metoprolol ER 50 mg daily.  9. Pravachol 80 mg at bedtime.  10. Proscar 5 mg daily.  11. Flomax 0.4 mg at bedtime.  12. Vitamin D 1000 international units daily.   SOCIAL HISTORY: He lives at home with his wife. He does not ambulate very much. He used to work in [employer] work in the past. He smokes a half-pack per day. No alcohol. No drug use.   FAMILY HISTORY: Father  died in his 20s of colon cancer. Mother died in her 74s of kidney cancer.    REVIEW OF SYSTEMS: CONSTITUTIONAL: Positive for feeling cold. Positive for fatigue and weakness. No fever, chills, or sweats. EYES: He does wear reading glasses. EARS, NOSE, MOUTH, AND THROAT: No hearing loss. Positive for runny nose. Positive for postnasal drip. No difficulty swallowing. CARDIOVASCULAR: No chest pain. No palpitations. RESPIRATORY: No shortness of breath. No coughing, no sputum. No hemoptysis. GASTROINTESTINAL: Occasional diarrhea. No nausea. No vomiting. No abdominal pain. No bright red blood per rectum. No melena. GENITOURINARY: Positive for hematuria. No difficulty passing urine. MUSCULOSKELETAL: No joint pain or muscle pain. INTEGUMENT: No rashes or eruptions but some soft heels and some scabbing on the left heel. NEUROLOGICAL: No fainting or blackouts. PSYCHIATRIC: No anxiety or depression. ENDOCRINE: No thyroid problems. HEMATOLOGIC/LYMPHATIC: No history of anemia.   PHYSICAL EXAMINATION:  VITAL SIGNS: Temperature 99.3, pulse 70, respirations 18, blood pressure 141/74, and pulse oximetry 100% on room air.   GENERAL: No respiratory distress.   HEENT: Eyes: Conjunctivae normal. Right lower lid stye. Pupils are equal, round, and reactive to light. Extraocular muscles are intact. No nystagmus. Ears, nose, mouth, and throat: Tympanic membranes no erythema. Nasal mucosa no erythema. Throat no erythema. No exudate seen. Lips and gums no lesions.   NECK: No JVD. No bruits. No lymphadenopathy. No thyromegaly. No thyroid nodules palpated.   LUNGS: Lungs are clear to auscultation. No use of accessory muscles to  breathe. No rhonchi, rales, or wheeze heard.   HEART: S1, S2 normal. A positive 2/6 systolic ejection murmur. Carotid upstroke 2+ bilaterally. No bruits.   EXTREMITIES: Dorsalis pedis pulses 2+ bilaterally. 2+ edema of bilateral lower extremities.   ABDOMEN: Soft, nontender. No  organomegaly/splenomegaly. Normoactive bowel sounds. No masses felt.   LYMPHATIC: No lymph nodes in the neck.   MUSCULOSKELETAL: 2+ edema. No clubbing. No cyanosis.   SKIN: Fungal toenails curving over the toe and underneath the toe on numerous toenails. On the left heel, there is scab and dried skin. No signs of infection.   NEUROLOGICAL: Cranial nerves II through XII are grossly intact. Deep tendon reflexes are 1+ bilateral lower extremities. The patient is moving all extremities, able to straight leg raise bilaterally.   PSYCHIATRIC: The patient is oriented to person, place, and time.   LABORATORY, DIAGNOSTIC AND RADIOLOGICAL DATA:   Urinalysis: Gross blood, 3+ bacteria.  CT scan of the abdomen shows nonobstructive bilateral medullary calculi without evidence of obstructive uropathy. Diffuse urinary bladder wall thickening.  White blood cell count 11.3, hemoglobin and hematocrit 11.4 and 33.5, platelet count of 178. Glucose 141, BUN 40, creatinine 2.99, sodium 143, potassium 3.9, chloride 107, CO2 26, calcium 8.6, GFR 27.  INR 1.1, PT 14.9, PTT 32.5.      ASSESSMENT AND PLAN:  1. Acute renal failure on chronic kidney disease: We will give IV fluid hydration, hold the lisinopril at this point. The patient does follow-up as an outpatient with the nephrologist. The last time he was in the hospital last year he was sent home with a creatinine of 1.9.  2. Gross hematuria: We will stop aspirin and Pletal. A Urology consult with Dr. Achilles Dunkope, I discussed the case with him. He recommended not putting in Foley unless he has obstructive symptoms. He will see the patient, and the patient most likely will need another cystoscopy and further work-up as outpatient. We will give IV fluid hydration, treat benign prostatic hypertrophy with Proscar, and will increase Flomax to 0.8 mg daily. We will check a urine culture. We will empirically put on Rocephin at this point, and I will check a postvoid residual.   3. Diabetes: We will put on sliding scale and continue Lantus.  4. Hyperlipidemia:  Continue Pravachol.  5. Benign prostatic hypertrophy: Continue Proscar and increase Flomax.  6. Tobacco abuse: We will put on a nicotine patch. Smoking cessation done by me for three minutes.  7. Skin breakdown on heel, slight heel decubitus:  We will put on heel protectors. He will end up needing to be seen by Podiatry as outpatient to cut those toenails and follow up on the heel wound. No need for debridement currently at this time.   CODE STATUS:  The patient is a DO NOT RESUSCITATE.     TIME SPENT ON ADMISSION: 55 minutes.  ____________________________ Herschell Dimesichard J. Renae GlossWieting, MD rjw:cbb D: 10/19/2011 14:29:28 ET T: 10/19/2011 15:22:47 ET JOB#: [id number]  cc: Herschell Dimesichard J. Renae GlossWieting, MD, <Dictator> Dennison MascotLemont Morrisey, MD Salley ScarletICHARD J Rip Hawes MD ELECTRONICALLY SIGNED 10/19/2011 16:07

## 2014-12-19 NOTE — H&P (Signed)
PATIENT NAME:  Walter [name], Walter [name] MR#:  [id number] DATE OF BIRTH:  11-24-1945  DATE OF ADMISSION:  11/28/2011  PRIMARY CARE PHYSICIAN:  Dr. Dennison MascotLemont Walter [name]   CHIEF COMPLAINT: Dizziness.  HISTORY OF PRESENT ILLNESS:  This is a 69 year old male who has a history of cerebrovascular accident with residual right-sided weakness. His wife, who is not present at the moment, told the ER physician that she was bathing him this morning and he was unable to cooperate, just weak all over. Walter [name] is more coherent now. He is actually able to answer my questions. He said the room was spinning and he was describing what was true vertigo. He said he just felt like he was out of it. He had recently been treated with medication for a bladder infection, which sounds like Septra from what he described. On his labs he was found to have acute on chronic renal failure with an elevated creatinine. He said he has been eating and drinking okay. It does not appear to be prerenal. Because of this and the neurologic changes that apparently happened, we will go ahead and admit him for further evaluation and treatment.   PAST MEDICAL HISTORY:  1. Cerebrovascular accident with right upper extremity weakness.  2. Insulin-dependent diabetes.  3. Hypertension.  4. Stage III chronic renal failure.  5. Hyperlipidemia.  6. Benign prostatic hypertrophy.   PAST SURGICAL HISTORY:  1. Cholecystectomy.  2. Appendectomy.  3. Right eye laser surgery.   ALLERGIES: No known drug allergies.   CURRENT MEDICATIONS:  1. Pravachol 80 mg at bedtime.  2. Pletal 100 mg b.i.d.  3. Proscar 5 mg daily.  4. Flomax 0.4 mg daily.  5. Lantus 20 units daily.  6. Ambien CR 12.5 mg at bedtime.  7. Metoprolol ER 50 mg daily.  8. Amlodipine 10 mg daily.   SOCIAL HISTORY: Lives with his wife.   FAMILY HISTORY:  Son is on dialysis.   REVIEW OF SYSTEMS: CONSTITUTIONAL: No fever or chills. EYES: No blurred vision. ENT: No hearing loss. CARDIOVASCULAR:  No chest pain. PULMONARY: No shortness of breath. GI: He has had no nausea, vomiting, or diarrhea. GU: He has had some dysuria. ENDOCRINE: No heat or cold intolerance. INTEGUMENT: No rash. MUSCULOSKELETAL: Occasional joint pain. NEUROLOGIC: He has right-sided hemiparesis and some slurred speech.  SKIN: No rash.  PHYSICAL EXAMINATION:  VITAL SIGNS: Temperature 97.9, pulse 71, respirations 18, blood pressure 118/63.   GENERAL: This is a well-nourished black male in no acute distress.   HEENT: The pupils are equal, round, and reactive to light. Sclerae anicteric. Oral mucosa is moist.  Oropharynx is clear.  Nasopharynx is clear.   NECK: Supple. No JVD, lymphadenopathy, or thyromegaly.   CARDIOVASCULAR: Regular rate and rhythm. No murmurs, rubs, or gallops.   LUNGS: Clear to auscultation. No dullness to percussion. He is not using accessory muscles.   ABDOMEN: Soft, nontender, nondistended. Bowel sounds are positive. No hepatosplenomegaly. No masses.   EXTREMITIES: There is trace lower extremity edema.   NEUROLOGIC: He has slurred speech. He has 4/5 weakness in the right upper extremity, 4+ weakness in the right lower extremity. The left upper and lower extremities have 5/5 strength. There is no decrease in sensation in the upper and lower extremities. I could not provoke any vertigo.   SKIN: Moist with no rash.   DIAGNOSTIC DATA: CT scan of the brain shows no acute abnormalities. Urinalysis shows 1+ leukocyte esterase and a large number of white blood cells. BUN 35, creatinine  3.4, sodium 141, potassium 4.3. White blood cells 8.2, hemoglobin 12.2.   ASSESSMENT AND PLAN:  1. Acute on chronic renal failure: His creatinine is up. It does not appear to be prerenal. I suspect if he was on Septra it may be some nephrotoxicity from that. I will go ahead and change his class of antibiotics, put him on some Rocephin, give him IV fluids gently, see how this responds. Could check for eosinophils in his  urine. If he does not respond overnight then we will consider consulting nephrology especially with his family history of kidney disease.  2. Urinary tract infection:  I will go ahead and switch him over to Rocephin. I am also going to culture his urine.  3. Vertigo: What he described appears to be pure vertigo rather than just dizziness. Certainly that along with what apparently was some transient altered mental status could represent a new cerebrovascular accident. CT did not show anything but we will go ahead and proceed and get an MRI.  4. Insulin-dependent diabetes. We will continue his Lantus and add some sliding scale.   TIME SPENT ON ADMISSION: 50 minutes.  ____________________________ Gracelyn Nurse, MD jdj:bjt D: 11/28/2011 16:28:01 ET T: 11/28/2011 17:17:38 ET JOB#: [id number]  cc: Gracelyn Nurse, MD, <Dictator> Dennison Mascot, MD Gracelyn Nurse MD ELECTRONICALLY SIGNED 12/02/2011 23:58

## 2014-12-19 NOTE — Consult Note (Signed)
PATIENT NAME:  Walter [name], MADLOCK MR#:  [id number] DATE OF BIRTH:  [birth year]  DATE OF CONSULTATION:  10/19/2011  REFERRING PHYSICIAN:   CONSULTING PHYSICIAN:  Madolyn Frieze. Achilles Dunk, MD  HISTORY: Mr. Radin is a 69 year old African American gentleman. He recently began experiencing initial pink-tinged urine that then turned dark in nature. He presented to the Emergency Room for further evaluation. A CT scan was obtained which by radiology report demonstrated bilateral renal calculi. On examination of the CT scan, there is no definitive evidence of renal calculi. He has extensive bilateral vascular calcifications. The findings are more consistent with vascular calcifications. He also has extensive vascular calcification throughout the chest, abdomen, and pelvis. The bladder demonstrates no significant bladder wall thickening. There is no significant bladder base elevation. The prostate is of appropriate size for age. There is no evidence of hydronephrosis or obstruction. There is no evidence of stone within the course of the ureter. The urinalysis cannot be entirely relied upon for evaluation of infection. The nitrite status was unable to be determined. White blood cells were present. This may represent an acute infection. His white blood cell count is only mildly elevated on his CBC. His serum creatinine is slightly elevated to 2.9 from a baseline of 1.9. This could also be related to infection or dehydration. He has had no significant change in voiding symptoms. He denies any dysuria. He has had no increased frequency or urgency over his baseline. He denies any prior history of gross hematuria. He denies any prior history of known urinary tract infections or prostatitis. He was seen by Dr. Evelene Croon approximately one year ago. He underwent cystoscopy which demonstrated no gross abnormalities by his wife's report. The potential causes of the bleeding was discussed. He has been on Pletal as well as aspirin. His wife recently  restarted the aspirin just before the onset of bleeding. This could certainly be a factor. He denies any prior history of nephrolithiasis. He denies any other significant prior urological history.   PAST MEDICAL HISTORY:   1. Diabetes mellitus.  2. Hypertension.  3. Cerebrovascular disease with prior stroke.  4. Hyperlipidemia.  5. Chronic renal insufficiency. 6. Benign prostatic hypertrophy.   PAST SURGICAL HISTORY:  1. Cholecystectomy. 2. Appendectomy. 3. Right eye laser surgery.   ALLERGIES: No known drug allergies.   MEDICATIONS ON PRESENTATION:  1. Percocet 5/325, one to two tablets every six hours as needed. 2. Ambien 12.5 mg at bedtime.  3. ASA 81 milligrams. 4. Pletal 100 mg twice daily.  5. Insulin.  6. Lisinopril 20 mg. 7. Metoprolol 50 mg daily. 8. Pravachol 80 mg at bedtime. 9. Proscar 5 mg daily.  10. Flomax 0.4 mg at bedtime.  11. Vitamin D 1,000 units daily.   SOCIAL HISTORY: The patient has a long history of tobacco use. He denies any significant alcohol or drug use.   PHYSICAL EXAMINATION:  VITAL SIGNS: Vital signs stable.   HEENT: Within normal limits.   CHEST: Clear to auscultation bilaterally.   CARDIOVASCULAR: Regular rate and rhythm.   ABDOMEN: Soft, nontender, nondistended. No palpable masses. Bladder nonpalpable.   EXTREMITIES: Free range of motion x4.   NEUROLOGIC: Motor and sensory grossly intact.   ASSESSMENT:  1. Gross hematuria.   2. Benign prostatic hypertrophy. 3. Incomplete bladder emptying.  4. Acute on chronic renal failure.   RECOMMENDATION: Holding the Pletal and aspirin at present would be recommended. When the medication is restarted, Pletal only would be suggested. Refraining from the use of any added  aspirin will lessen the chance of future bleeding. Go ahead and treat as if this is a bladder infection pending culture. He will need an eventual outpatient cystoscopy. I do not feel that catheter placement is absolutely  indicated for now. Increase the Flomax as you have started. Recheck a bladder scan in the morning. If there is worsening of the bladder volume, catheter placement would then be recommended. Avoidance of catheter placement with the Pletal and aspirin on board would be suggested at present. If he does develop gross red blood with clots or clot retention, catheter placement with irrigation would then be appropriate. No further urological intervention is indicated for now. We will notify the weekend coverage of his situation. If there are any further questions, please free to contact us.   ____________________________ Madolyn FriezeBrian S. Achilles Dunkope, MD bsc:ap D: 10/19/2011 16:18:32 ET T: 10/20/2011 09:30:24 ET JOB#: [id number]  cc: Madolyn FriezeBrian S. Achilles Dunkope, MD, <Dictator> Madolyn FriezeBRIAN S Simmie Camerer MD ELECTRONICALLY SIGNED 10/23/2011 7:07

## 2014-12-19 NOTE — Discharge Summary (Signed)
PATIENT NAME:  Walter [name], Walter [name] MR#:  [id number] DATE OF BIRTH:  07-19-1946  DATE OF ADMISSION:  10/19/2011 DATE OF DISCHARGE:  10/22/2011  PRESENTING COMPLAINT: Passing bloody urine.   DISCHARGE DIAGNOSES:  1. Gross hematuria, resolved, exact etiology unclear.  2. Nonobstructive bilateral nephrolithiasis.  3. Benign prostatic hypertrophy.  4. History of cerebrovascular accident in the past. The patient has right-sided weakness, mostly bedbound, walks very little with a walker.  5. Type 2 diabetes.  6. Hypertension.  7. Chronic kidney disease.   CONDITION ON DISCHARGE: Fair. Vitals stable.   MEDICATIONS:  1. Pravachol 80 mg p.o. at bedtime.  2. Pletal 100 mg b.i.d.  3. Vitamin D3 1000 international units p.o. daily.  4. Proscar 5 mg daily.  5. Tamsulosin 0.4 mg, 1 tablet daily.  6. Lantus 20 units daily subcutaneous.  7. Ambien CR 12.5 mg at bedtime.  8. Metoprolol ER 50 mg daily.  9. Amlodipine 10 mg daily.   NOTE: The patient was advised not to take lisinopril and aspirin.   FOLLOWUP:  1. Home Health to follow upon discharge.  2. Follow up with Dr. Achilles Dunkope on 11/01/2011 at 11:15 a.[name].  [facility], Dr. Thana AtesMorrisey, in 1 to 2 weeks.   DISCHARGE LABORATORY, DIAGNOSTIC AND RADIOLOGICAL DATA:  Hemoglobin is 10.2, creatinine is 2.48.  Urine cultures: No growth in 18 to 24 hours.  White count was 10.1, platelet count was 171.   CONSULTATIONS: Urology consultation with Dr. Achilles Dunkope.   CODE STATUS: DO NOT RESUSCITATE.   BRIEF SUMMARY OF HOSPITAL COURSE: Walter [name] is a 69 year old African American gentleman who comes into the Emergency Room with:   1. Acute on chronic renal failure. The patient came in with a creatinine of 2.67. He was started on IV fluids. Creatinine was down to 2.48 prior to discharge. His lisinopril was held. His baseline creatinine is around 2.0. The patient was making good urine and appeared to be clearing from his hematuria.  2. Gross  hematuria with history of bilateral nonobstructing nephrolithiasis, exact cause unclear: The patient was seen by Dr. Achilles Dunkope, who recommended discontinuing the patient's aspirin. The patient did not require any Foley catheter placement. He was urinating well. His Pletal/cilostazol  was resumed. Dr. Achilles Dunkope will see the patient as an outpatient. His treatment for benign prostatic hypertrophy was continued with Flomax and Proscar. Urine culture showed different results. The first on the 22nd showed positive for gram-negative rods with Klebsiella. A second one done on the 23rd showed mixed bacterial organisms. The fourth sample sent on the 24th showed no growth in 18 to 24 hours. The patient initially was on IV antibiotics. After discussion with Dr. Achilles Dunkope, and the patient having no clinical signs or symptoms of urinary tract infection, antibiotics were discontinued.  3. Hyperlipidemia: On pravastatin.  4. Benign prostatic hypertrophy:  Proscar and Flomax were continued.  5. Heel decubitus: Heel protectors were continued. The patient will need also Podiatry to cut toenails as outpatient. This was discussed with the patient's wife. 6. The hospital stay otherwise remained stable. This patient will be discharged to home with Home  Health with physical therapy.   CODE STATUS:  He remained a NO CODE, DO NOT RESUSCITATE.        TIME SPENT: 40 minutes.   ____________________________ Wylie HailSona A. Allena KatzPatel, MD sap:cbb D: 10/22/2011 14:39:52 ET T: 10/22/2011 17:03:19 ET JOB#: [id number]  cc: Ulises Wolfinger A. Allena KatzPatel, MD, <Dictator> Madolyn FriezeBrian S. Achilles Dunkope, MD Dennison MascotLemont Morrisey, MD Davis Vannatter A Tywanda Rice  MD ELECTRONICALLY SIGNED 10/31/2011 6:23

## 2014-12-19 NOTE — Consult Note (Signed)
Patient seen, chart reviewed, films reviewed, note dictated.  Gross hematuria, incomplete bladder emptying, BPH, acute on chronic renal failure The patient denies any significant recent change in voiding symptoms.  He states he voids with a reasonable stream.  He has noted no increased frequency   He has been on Pletal.  He was supposed to have been on aspirin.   His wife had forgotten to purchase the medication until recently.  He has been on the added aspirin only a few days prior to the onset of bleeding.  He has had no symptoms suggestive of a bladder infection or urinary retention.  He initially noted the onsed urine that been turned more dark in color.  There has been no bright red  bleeding. Today's post void bladder residual was around 389 mL. He has been on Flomax.  The dose has been increased to 8 mg up on presentation. There is a reasonable chance that this will improve with the added does the medication.  I am very hesitant to proceed with catheter placement given the added blood thinners.  This can sometimes lead to additional bleeding.  A CT scan was read as bilateral renal calculi.  The findings are consistent with extensive vascular calcificationthroughout both kidneys.  There are no definitive stones.  He has extensive vascular calcification throughout his entire chest, abdomen, and pelvis. there is no significant bladder wall thickening.  The prostate demonstrates no overall significant enlargement for age.  There is no significant bladder base elevation.  There is no gross evidence of clot.  It is reasonable to treat for a presumed bladder infection pending culture.  Discontinuation of the blood thinners for now is also recommended.  Upon restart, Pletal without aspirin and would be all that would be recommended.  He will need an eventual  outpatient cystoscopy.  If he develops gross red bleeding with clot retention,  catheter placement and irrigation with then be indicated.  If he also  demonstrates higher bladder volumes or true urinary retention catheter placement with also then be recommended.  A repeat CT scan in the morning is reasonable.  The weekend coverage will be notified of the situation. No further intervention is indicated at present.   Please contact us with any other issues.  Electronic Signatures: Behrmann Robertope, Ryleah Miramontes S (MD)  (Signed on 22-Feb-13 16:12)  Authored  Last Updated: 22-Feb-13 16:12 by Nolton Robertope, Dianey Suchy S (MD)

## 2014-12-19 NOTE — Discharge Summary (Signed)
PATIENT NAME:  Walter [name], Walter [name] MR#:  [id number] DATE OF BIRTH:  01/21/1946  DATE OF ADMISSION:  11/28/2011 DATE OF DISCHARGE:  12/01/2011  ADMITTING PHYSICIAN: Walter DusterJohn Johnston, MD  DISCHARGING PHYSICIAN: Walter Baasadhika Adja Ruff, MD  PRIMARY CARE PHYSICIAN: Walter MascotLemont Morrisey, MD  PRIMARY UROLOGIST: Walter GamblesBrian Cope, MD  CONSULTANTS: None.   DISCHARGE DIAGNOSES:  1. Acute cerebrovascular accident with tiny punctate infarcts, one on the right side and two on the left side.   2. 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  3. Hypertension.  4. Diabetes mellitus with episodes of hypoglycemia while in the hospital. His Lantus is being stopped at the time of discharge.  5. Urinary incontinence.  6. Urinary tract infection.  7. Acute renal failure.  8. Chronic kidney disease stage III. 9. Metabolic encephalopathy.  DISCHARGE HOME MEDICATIONS:  1. Pravachol 80 mg p.o. daily.  2. Proscar 5 mg p.o. daily.  3. Flomax 0.4 mg p.o. at bedtime.  4. Ambien CR 12.5 mg p.o. at bedtime p.r.n. for sleep.  5. Norvasc 10 mg p.o. daily.  6. Cilostazol 100 mg p.o. twice a day.  7. Toprol 50 mg p.o. twice a day. 8. Celexa 20 mg daily.  9. Aspirin 81 mg p.o. daily.   NOTE: The following medications are being held at discharge: Lantus and lisinopril.  DISCHARGE DIET: Low-sodium diet.   DISCHARGE ACTIVITY: As tolerated.   FOLLOWUP INSTRUCTIONS:  1. PCP follow-up in one week.  2. Resume home health.  3. Blood sugar checks at home and check with primary care physician prior to restarting Lantus.   LABS AND/IMAGING STUDIES: WBC 8.2, hemoglobin 12.2, hematocrit 37.0, platelet count 201.   Labs prior to discharge: Sodium 143, potassium 4.1, chloride 114, bicarbonate 20, BUN 34, creatinine 2.8, glucose 65, and calcium 7.9. GFR 28. Hemoglobin A1c 7.8.   Echo Doppler showed no source cerebrovascular accident noted. No LA or LV mass or thrombus. LV systolic function is low normal. Ejection fraction  50 to 55%. Mild concentric left LVH is present. No wall motion abnormalities.   Ultrasound Doppler of carotids showed no hemodynamically significant stenosis.   Ultrasound of kidneys bilaterally showed diffuse thickening of the urinary bladder, possibly cystitis, nonobstructing bilateral renal calculi, normal kidney sizes, and no hydronephrosis or masses identified.  Urine culture is negative.  MRI of the brain showed punctate acute infarct of right lenticular nucleus and two small punctate acute infarctions in the left periventricular white matter.  Chest x-ray on admission showed no acute cardiopulmonary disease.   Urinalysis showed 1+ leukocyte esterase, no bacteria, and few WBCs.   CT of the head showed fairly extensive white matter disease. No evidence of acute ischemic or hemorrhagic infarction.   ALT 111, AST 122, alkaline phosphatase 134, total bilirubin 0.3, and albumin 3.0. INR 1.1. Troponin negative.   BRIEF HOSPITAL COURSE: Walter [name] is a 69 year old African American male with prior history of CVA with residual right-sided weakness who lives at home with his wife who was brought into the hospital secondary to altered mental status and also dehydration associated with vertigo.   1. Altered mental status initially thought to be metabolic encephalopathy secondary to possible urinary tract infection and also acute renal failure. He was hydrated with improvement in renal function. He was started on Rocephin antibiotic while in the hospital and finished a five day course. He was also treated with Bactrim for five days prior to being inpatient, however, his MRI of the brain showed tiny punctate infarcts which possibly might  not have contributed to his altered mental status all by themselves anyway. He was on aspirin in the past which was stopped recently secondary to intermittent hematuria, but aspirin was restarted at this time. He can follow up with PCP as an outpatient. Physical therapy  worked with the patient and recommended rehab, but his wife wanted to take him home so home health was resumed, at the time of discharge. He does not have any new neurological deficits from his new stroke but does have old right-sided weakness.  2. Acute on chronic renal failure, stage III. Baseline creatinine appears to be around 1.6 to 2 range. His creatinine was 3.4 at the time of admission. He was not hyperkalemic, but was on Bactrim as an outpatient and dehydrated so IV fluids were given and Bactrim was stopped. Renal ultrasound did not show any obstruction or hydronephrosis, other than cystitis. His creatinine has been improving, so he is being discharged. He will need outpatient blood pressure followup in one week.  3. Insulin-dependent diabetes mellitus with hypoglycemia episode. He was started on Lantus in the hospital for his A1c of 7.8. Sugars dropped as low as 49 with changes in mental status so Lantus was held. He was given D50. The patient's mental status has improved and stayed at baseline since then.  4. Hypertension. He is on metoprolol and Norvasc.   His course has been otherwise uneventful in the hospital.   DISCHARGE CONDITION: Stable.   DISCHARGE DISPOSITION: Home.   CODE STATUS: DO NOT RESUSCITATE, as discussed on admission.  TIME SPENT ON DISCHARGE: 45 minutes.  ____________________________ Walter Baas, MD [id number] D: 12/02/2011 12:38:11 ET T: 12/03/2011 14:56:49 ET JOB#: [id number]  cc: Walter Baas, MD, <Dictator> Walter Mascot, MD Walter Baas MD ELECTRONICALLY SIGNED 12/05/2011 13:43
